# Patient Record
Sex: Male | Born: 1960 | Race: Black or African American | Hispanic: No | Smoking: Current every day smoker
Health system: Southern US, Community
[De-identification: ages and names within clinical notes are randomized; demographics above are authoritative.]

## PROBLEM LIST (undated history)

## (undated) DIAGNOSIS — F1410 Cocaine abuse, uncomplicated: Secondary | ICD-10-CM

## (undated) DIAGNOSIS — Z720 Tobacco use: Secondary | ICD-10-CM

## (undated) DIAGNOSIS — Z970 Presence of artificial eye: Secondary | ICD-10-CM

## (undated) DIAGNOSIS — F1010 Alcohol abuse, uncomplicated: Secondary | ICD-10-CM

## (undated) HISTORY — PX: EYE SURGERY: SHX253

## (undated) HISTORY — PX: OTHER SURGICAL HISTORY: SHX169

---

## 1983-01-02 DIAGNOSIS — Z970 Presence of artificial eye: Secondary | ICD-10-CM

## 1983-01-02 HISTORY — DX: Presence of artificial eye: Z97.0

## 2009-12-06 ENCOUNTER — Inpatient Hospital Stay (HOSPITAL_COMMUNITY)
Admission: EM | Admit: 2009-12-06 | Discharge: 2009-12-08 | Payer: Self-pay | Source: Home / Self Care | Attending: Internal Medicine | Admitting: Internal Medicine

## 2010-03-14 LAB — CBC
HCT: 39.2 % (ref 39.0–52.0)
Hemoglobin: 13.3 g/dL (ref 13.0–17.0)
Hemoglobin: 13.4 g/dL (ref 13.0–17.0)
MCH: 32.9 pg (ref 26.0–34.0)
MCHC: 34.2 g/dL (ref 30.0–36.0)
MCHC: 34.8 g/dL (ref 30.0–36.0)
MCV: 96.3 fL (ref 78.0–100.0)
RDW: 12.4 % (ref 11.5–15.5)
RDW: 12.5 % (ref 11.5–15.5)
WBC: 5.1 10*3/uL (ref 4.0–10.5)
WBC: 5.1 10*3/uL (ref 4.0–10.5)

## 2010-03-14 LAB — URINALYSIS, ROUTINE W REFLEX MICROSCOPIC
Glucose, UA: NEGATIVE mg/dL
Hgb urine dipstick: NEGATIVE

## 2010-03-14 LAB — POCT CARDIAC MARKERS: Myoglobin, poc: 47.4 ng/mL (ref 12–200)

## 2010-03-14 LAB — BASIC METABOLIC PANEL
CO2: 25 mEq/L (ref 19–32)
Chloride: 107 mEq/L (ref 96–112)
Creatinine, Ser: 0.83 mg/dL (ref 0.4–1.5)
GFR calc Af Amer: >60 mL/min (ref >60)

## 2010-03-14 LAB — LIPID PANEL
Cholesterol: 198 mg/dL (ref 0–200)
HDL: 69 mg/dL (ref >39)
Total CHOL/HDL Ratio: 2.9 RATIO
VLDL: 15 mg/dL (ref 0–40)

## 2010-03-14 LAB — DIFFERENTIAL
Basophils Absolute: 0 10*3/uL (ref 0.0–0.1)
Basophils Relative: 1 % (ref 0–1)
Eosinophils Absolute: 0.1 10*3/uL (ref 0.0–0.7)
Neutrophils Relative %: 58 % (ref 43–77)

## 2010-03-14 LAB — COMPREHENSIVE METABOLIC PANEL
AST: 20 U/L (ref 0–37)
Albumin: 4.2 g/dL (ref 3.5–5.2)
BUN: 8 mg/dL (ref 6–23)
CO2: 26 mEq/L (ref 19–32)
Calcium: 9 mg/dL (ref 8.4–10.5)
Glucose, Bld: 82 mg/dL (ref 70–99)
Sodium: 141 mEq/L (ref 135–145)
Total Protein: 6.5 g/dL (ref 6.0–8.3)

## 2010-03-14 LAB — CARDIAC PANEL(CRET KIN+CKTOT+MB+TROPI)
CK, MB: 1.2 ng/mL (ref 0.3–4.0)
Total CK: 125 U/L (ref 7–232)
Total CK: 150 U/L (ref 7–232)
Troponin I: 0.01 ng/mL (ref 0.00–0.06)

## 2010-03-14 LAB — APTT: aPTT: 30 seconds (ref 24–37)

## 2010-03-14 LAB — RAPID URINE DRUG SCREEN, HOSP PERFORMED
Benzodiazepines: NOT DETECTED
Opiates: NOT DETECTED

## 2010-03-14 LAB — PROTIME-INR: INR: 1.12 (ref 0.00–1.49)

## 2010-03-14 LAB — URINE CULTURE: Colony Count: NO GROWTH

## 2010-03-14 LAB — TSH: TSH: 0.311 u[IU]/mL — ABNORMAL LOW (ref 0.350–4.500)

## 2010-09-18 ENCOUNTER — Emergency Department (HOSPITAL_COMMUNITY)
Admission: EM | Admit: 2010-09-18 | Discharge: 2010-09-18 | Disposition: A | Discharge status: Home / Self Care | Payer: Self-pay | Attending: Emergency Medicine | Admitting: Emergency Medicine

## 2010-09-18 DIAGNOSIS — W268XXA Contact with other sharp object(s), not elsewhere classified, initial encounter: Secondary | ICD-10-CM | POA: Insufficient documentation

## 2010-09-18 DIAGNOSIS — S51809A Unspecified open wound of unspecified forearm, initial encounter: Secondary | ICD-10-CM | POA: Insufficient documentation

## 2010-09-18 DIAGNOSIS — S41109A Unspecified open wound of unspecified upper arm, initial encounter: Secondary | ICD-10-CM | POA: Insufficient documentation

## 2010-09-18 DIAGNOSIS — Y92009 Unspecified place in unspecified non-institutional (private) residence as the place of occurrence of the external cause: Secondary | ICD-10-CM | POA: Insufficient documentation

## 2010-09-18 DIAGNOSIS — R569 Unspecified convulsions: Secondary | ICD-10-CM | POA: Insufficient documentation

## 2010-10-10 ENCOUNTER — Inpatient Hospital Stay (INDEPENDENT_AMBULATORY_CARE_PROVIDER_SITE_OTHER)
Admission: RE | Admit: 2010-10-10 | Discharge: 2010-10-10 | Disposition: A | Discharge status: Home / Self Care | Payer: Medicaid Other | Source: Ambulatory Visit | Attending: Emergency Medicine | Admitting: Emergency Medicine

## 2010-10-10 DIAGNOSIS — Z4802 Encounter for removal of sutures: Secondary | ICD-10-CM

## 2013-12-25 ENCOUNTER — Encounter (HOSPITAL_COMMUNITY): Payer: Self-pay | Admitting: Emergency Medicine

## 2013-12-25 ENCOUNTER — Emergency Department (HOSPITAL_COMMUNITY)
Admission: EM | Admit: 2013-12-25 | Discharge: 2013-12-25 | Disposition: A | Discharge status: Home / Self Care | Payer: Medicaid Other | Attending: Emergency Medicine | Admitting: Emergency Medicine

## 2013-12-25 ENCOUNTER — Emergency Department (HOSPITAL_COMMUNITY): Payer: Medicaid Other

## 2013-12-25 DIAGNOSIS — S61218A Laceration without foreign body of other finger without damage to nail, initial encounter: Secondary | ICD-10-CM

## 2013-12-25 DIAGNOSIS — Z720 Tobacco use: Secondary | ICD-10-CM | POA: Diagnosis not present

## 2013-12-25 DIAGNOSIS — Y929 Unspecified place or not applicable: Secondary | ICD-10-CM | POA: Diagnosis not present

## 2013-12-25 DIAGNOSIS — W260XXA Contact with knife, initial encounter: Secondary | ICD-10-CM | POA: Diagnosis not present

## 2013-12-25 DIAGNOSIS — Z23 Encounter for immunization: Secondary | ICD-10-CM | POA: Diagnosis not present

## 2013-12-25 DIAGNOSIS — Y9389 Activity, other specified: Secondary | ICD-10-CM | POA: Insufficient documentation

## 2013-12-25 DIAGNOSIS — S61210A Laceration without foreign body of right index finger without damage to nail, initial encounter: Secondary | ICD-10-CM | POA: Insufficient documentation

## 2013-12-25 DIAGNOSIS — Y998 Other external cause status: Secondary | ICD-10-CM | POA: Diagnosis not present

## 2013-12-25 MED ORDER — HYDROCODONE-ACETAMINOPHEN 5-325 MG PO TABS: 1.0000 | ORAL_TABLET | Freq: Four times a day (QID) | ORAL | Status: DC | PRN

## 2013-12-25 MED ORDER — CEPHALEXIN 500 MG PO CAPS: 500.0000 mg | ORAL_CAPSULE | Freq: Two times a day (BID) | ORAL | Status: DC

## 2013-12-25 MED ORDER — LIDOCAINE HCL (PF) 1 % IJ SOLN
INTRAMUSCULAR | Status: AC
  Filled 2013-12-25: qty 10

## 2013-12-25 MED ORDER — CEPHALEXIN 250 MG PO CAPS
500.0000 mg | ORAL_CAPSULE | Freq: Once | ORAL | Status: AC
  Administered 2013-12-25: 500 mg via ORAL
  Filled 2013-12-25: qty 2

## 2013-12-25 MED ORDER — HYDROCODONE-ACETAMINOPHEN 5-325 MG PO TABS: 1.0000 | ORAL_TABLET | ORAL | Status: DC | PRN

## 2013-12-25 MED ORDER — LIDOCAINE HCL (PF) 1 % IJ SOLN: 30.0000 mL | Freq: Once | INTRAMUSCULAR | Status: DC

## 2013-12-25 MED ORDER — TETANUS-DIPHTH-ACELL PERTUSSIS 5-2.5-18.5 LF-MCG/0.5 IM SUSP
0.5000 mL | Freq: Once | INTRAMUSCULAR | Status: AC
  Administered 2013-12-25: 0.5 mL via INTRAMUSCULAR
  Filled 2013-12-25: qty 0.5

## 2013-12-25 MED ORDER — CEPHALEXIN 500 MG PO CAPS: 500.0000 mg | ORAL_CAPSULE | Freq: Two times a day (BID) | ORAL | Status: AC

## 2013-12-25 NOTE — Discharge Instructions (Signed)
Call and make an appointment to follow-up with Dr. Mina MarbleWeingold Monday morning. Return immediately to the emergency department for worsening pain, redness, swelling, fever or for any concerns.  Finger Avulsion  When the tip of the finger is lost, a new nail may grow back if part of the fingernail is left. The new nail may be deformed. If just the tip of the finger is lost, no repair may be needed unless there is bone showing. If bone is showing, your caregiver may need to remove the protruding bone and put on a bandage. Your caregiver will do what is best for you. Most of the time when a fingertip is lost, the end will gradually grow back on and look fairly normal, but it may remain sensitive to pressure and temperature extremes for a long time. HOME CARE INSTRUCTIONS   Keep your hand elevated above your heart to relieve pain and swelling.  Keep your dressing dry and clean.  Change your bandage in 24 hours or as directed.  Only take over-the-counter or prescription medicines for pain, discomfort, or fever as directed by your caregiver.  See your caregiver as needed for problems. SEEK MEDICAL CARE IF:   You have increased pain, swelling, drainage, or bleeding.  You have a fever.  You have swelling that spreads from your finger and into your hand. Make sure to check to see if you need a tetanus booster. Document Released: 02/26/2001 Document Revised: 06/19/2011 Document Reviewed: 01/22/2008 [facility] Patient Information 2015 [place]. This information is not intended to replace advice given to you by your health care provider. Make sure you discuss any questions you have with your health care provider.
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
## 2013-12-25 NOTE — ED Notes (Signed)
Pt given a meal per MD
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
## 2014-01-09 ENCOUNTER — Encounter (HOSPITAL_COMMUNITY): Payer: Self-pay | Admitting: *Deleted

## 2014-01-09 ENCOUNTER — Emergency Department (HOSPITAL_COMMUNITY)
Admission: EM | Admit: 2014-01-09 | Discharge: 2014-01-09 | Disposition: A | Discharge status: Home / Self Care | Payer: Medicaid Other | Attending: Emergency Medicine | Admitting: Emergency Medicine

## 2014-01-09 DIAGNOSIS — T814XXA Infection following a procedure, initial encounter: Secondary | ICD-10-CM | POA: Insufficient documentation

## 2014-01-09 DIAGNOSIS — Z720 Tobacco use: Secondary | ICD-10-CM | POA: Diagnosis not present

## 2014-01-09 DIAGNOSIS — M79641 Pain in right hand: Secondary | ICD-10-CM | POA: Diagnosis present

## 2014-01-09 DIAGNOSIS — T8140XA Infection following a procedure, unspecified, initial encounter: Secondary | ICD-10-CM

## 2014-01-09 DIAGNOSIS — Z87828 Personal history of other (healed) physical injury and trauma: Secondary | ICD-10-CM | POA: Insufficient documentation

## 2014-01-09 MED ORDER — HYDROCODONE-ACETAMINOPHEN 5-325 MG PO TABS
2.0000 | ORAL_TABLET | Freq: Once | ORAL | Status: AC
  Administered 2014-01-09: 2 via ORAL
  Filled 2014-01-09: qty 2

## 2014-01-09 MED ORDER — LIDOCAINE HCL (PF) 1 % IJ SOLN
5.0000 mL | Freq: Once | INTRAMUSCULAR | Status: AC
  Administered 2014-01-09: 5 mL via INTRADERMAL
  Filled 2014-01-09: qty 5

## 2014-01-09 MED ORDER — SULFAMETHOXAZOLE-TRIMETHOPRIM 800-160 MG PO TABS: 1.0000 | ORAL_TABLET | Freq: Two times a day (BID) | ORAL | Status: DC

## 2014-01-09 MED ORDER — NAPROXEN 500 MG PO TABS: 500.0000 mg | ORAL_TABLET | Freq: Two times a day (BID) | ORAL | Status: DC

## 2014-01-09 NOTE — Discharge Instructions (Signed)
Take the antibiotics as prescribed - see Dr. Merlyn LotKuzma for follow up   CHANGE DRESSING DAILY SOAK IN WARM SOAPY WATER DAILY  Please call your doctor for a followup appointment within 24-48 hours. When you talk to your doctor please let them know that you were seen in the emergency department and have them acquire all of your records so that they can discuss the findings with you and formulate a treatment plan to fully care for your new and ongoing problems.
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
## 2014-01-09 NOTE — ED Provider Notes (Signed)
INCISION AND DRAINAGE Performed by: Yussuf Sawyers K Consent: Verbal conseArman Filternt obtained. Risks and benefits: risks, benefits and alternatives were discussed Type: abscess  Body area:  R index finger tip   Anesthesia: local infiltration  Incision was made with a scalpel.  Local anesthetic: lidocaine 1% without epinephrine  Anesthetic total: 2.5 ml  Complexity: complex Blunt dissection to break up loculations  Drainage: purulent  Drainage amount: small  Packing material:  Left open   Patient tolerance: Patient tolerated the procedure well with no immediate complications.  &D NERVE BLOCK Performed by: Arman FilterSCHULZ,Umair Rosiles K Consent: Verbal consent obtained. Required items: required blood products, implants, devices, and special equipment available Time out: Immediately prior to procedure a "time out" was called to verify the correct patient, procedure, equipment, support staff and site/side marked as required.  Indication: infection  Nerve block body site: finger  Preparation: Patient was prepped and draped in the usual sterile fashion. Needle gauge: 24 G Location technique: anatomical landmarks  Local anesthetic: 1% lidocaine  Anesthetic total: 2/5 ml  Outcome: pain improved Patient tolerance: Patient tolerated the procedure well with no immediate complications.  Arman FilterGail K Lopaka Karge, NP 01/09/14 2147  Vida RollerBrian D Miller, MD 01/09/14 2202

## 2014-01-09 NOTE — ED Notes (Addendum)
Pt was seen here on 12/25 for laceration to right index finger, pt was referred to hand specialist but unable to follow up. Reports still having severe pain to finger.pt had original bandage still in place which was saturated, removed at triage. Reports having numbness sensation to left little finger x 1 week, denies injury to that finger.

## 2014-09-21 ENCOUNTER — Encounter (HOSPITAL_COMMUNITY): Payer: Self-pay | Admitting: Emergency Medicine

## 2014-09-21 ENCOUNTER — Emergency Department (HOSPITAL_COMMUNITY): Payer: Medicaid Other

## 2014-09-21 ENCOUNTER — Emergency Department (HOSPITAL_COMMUNITY)
Admission: EM | Admit: 2014-09-21 | Discharge: 2014-09-21 | Disposition: A | Discharge status: Home / Self Care | Payer: Medicaid Other | Attending: Emergency Medicine | Admitting: Emergency Medicine

## 2014-09-21 DIAGNOSIS — Y998 Other external cause status: Secondary | ICD-10-CM | POA: Diagnosis not present

## 2014-09-21 DIAGNOSIS — S4992XA Unspecified injury of left shoulder and upper arm, initial encounter: Secondary | ICD-10-CM | POA: Diagnosis present

## 2014-09-21 DIAGNOSIS — S40012A Contusion of left shoulder, initial encounter: Secondary | ICD-10-CM | POA: Diagnosis not present

## 2014-09-21 DIAGNOSIS — S8001XA Contusion of right knee, initial encounter: Secondary | ICD-10-CM | POA: Diagnosis not present

## 2014-09-21 DIAGNOSIS — F1410 Cocaine abuse, uncomplicated: Secondary | ICD-10-CM | POA: Diagnosis not present

## 2014-09-21 DIAGNOSIS — S7002XA Contusion of left hip, initial encounter: Secondary | ICD-10-CM | POA: Insufficient documentation

## 2014-09-21 DIAGNOSIS — Z720 Tobacco use: Secondary | ICD-10-CM | POA: Insufficient documentation

## 2014-09-21 DIAGNOSIS — S8002XA Contusion of left knee, initial encounter: Secondary | ICD-10-CM | POA: Diagnosis not present

## 2014-09-21 DIAGNOSIS — Y9389 Activity, other specified: Secondary | ICD-10-CM | POA: Diagnosis not present

## 2014-09-21 DIAGNOSIS — S7001XA Contusion of right hip, initial encounter: Secondary | ICD-10-CM | POA: Insufficient documentation

## 2014-09-21 DIAGNOSIS — Y92410 Unspecified street and highway as the place of occurrence of the external cause: Secondary | ICD-10-CM | POA: Insufficient documentation

## 2014-09-21 DIAGNOSIS — Z792 Long term (current) use of antibiotics: Secondary | ICD-10-CM | POA: Insufficient documentation

## 2014-09-21 DIAGNOSIS — F10120 Alcohol abuse with intoxication, uncomplicated: Secondary | ICD-10-CM | POA: Insufficient documentation

## 2014-09-21 DIAGNOSIS — F1010 Alcohol abuse, uncomplicated: Secondary | ICD-10-CM

## 2014-09-21 DIAGNOSIS — S0990XA Unspecified injury of head, initial encounter: Secondary | ICD-10-CM | POA: Insufficient documentation

## 2014-09-21 DIAGNOSIS — S0081XA Abrasion of other part of head, initial encounter: Secondary | ICD-10-CM | POA: Diagnosis not present

## 2014-09-21 LAB — RAPID URINE DRUG SCREEN, HOSP PERFORMED
Amphetamines: NOT DETECTED
BENZODIAZEPINES: NOT DETECTED
Barbiturates: NOT DETECTED
COCAINE: POSITIVE — AB
Opiates: NOT DETECTED
TETRAHYDROCANNABINOL: NOT DETECTED

## 2014-09-21 LAB — ETHANOL: ALCOHOL ETHYL (B): 167 mg/dL — AB (ref <5)

## 2014-09-21 MED ORDER — NAPROXEN 500 MG PO TABS: 500.0000 mg | ORAL_TABLET | Freq: Two times a day (BID) | ORAL | Status: DC

## 2014-09-21 MED ORDER — HYDROCODONE-ACETAMINOPHEN 5-325 MG PO TABS
1.0000 | ORAL_TABLET | Freq: Once | ORAL | Status: AC
  Administered 2014-09-21: 1 via ORAL
  Filled 2014-09-21: qty 1
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
## 2014-09-21 NOTE — Discharge Instructions (Signed)
You were seen today and found to have an abrasion on her face and a bruise to your left shoulder. The rest of your workup is negative.  He can take naproxen twice daily as needed for pain.  Abrasion An abrasion is a cut or scrape of the skin. Abrasions do not extend through all layers of the skin and most heal within 10 days. It is important to care for your abrasion properly to prevent infection. CAUSES  Most abrasions are caused by falling on, or gliding across, the ground or other surface. When your skin rubs on something, the outer and inner layer of skin rubs off, causing an abrasion. DIAGNOSIS  Your caregiver will be able to diagnose an abrasion during a physical exam.  TREATMENT  Your treatment depends on how large and deep the abrasion is. Generally, your abrasion will be cleaned with water and a mild soap to remove any dirt or debris. An antibiotic ointment may be put over the abrasion to prevent an infection. A bandage (dressing) may be wrapped around the abrasion to keep it from getting dirty.  You may need a tetanus shot if:  You cannot remember when you had your last tetanus shot.  You have never had a tetanus shot.  The injury broke your skin. If you get a tetanus shot, your arm may swell, get red, and feel warm to the touch. This is common and not a problem. If you need a tetanus shot and you choose not to have one, there is a rare chance of getting tetanus. Sickness from tetanus can be serious.  HOME CARE INSTRUCTIONS   If a dressing was applied, change it at least once a day or as directed by your caregiver. If the bandage sticks, soak it off with warm water.   Wash the area with water and a mild soap to remove all the ointment 2 times a day. Rinse off the soap and pat the area dry with a clean towel.   Reapply any ointment as directed by your caregiver. This will help prevent infection and keep the bandage from sticking. Use gauze over the wound and under the dressing to  help keep the bandage from sticking.   Change your dressing right away if it becomes wet or dirty.   Only take over-the-counter or prescription medicines for pain, discomfort, or fever as directed by your caregiver.   Follow up with your caregiver within 24-48 hours for a wound check, or as directed. If you were not given a wound-check appointment, look closely at your abrasion for redness, swelling, or pus. These are signs of infection. SEEK IMMEDIATE MEDICAL CARE IF:   You have increasing pain in the wound.   You have redness, swelling, or tenderness around the wound.   You have pus coming from the wound.   You have a fever or persistent symptoms for more than 2-3 days.  You have a fever and your symptoms suddenly get worse.  You have a bad smell coming from the wound or dressing.  MAKE SURE YOU:   Understand these instructions.  Will watch your condition.  Will get help right away if you are not doing well or get worse. Document Released: 09/27/2004 Document Revised: 12/05/2011 Document Reviewed: 11/21/2010 [facility] Patient Information 2015 [place], [place]. This information is not intended to replace advice given to you by your health care provider. Make sure you discuss any questions you have with your health care provider.  Alcohol Intoxication Alcohol intoxication occurs when  the amount of alcohol that a person has consumed impairs his or her ability to mentally and physically function. Alcohol directly impairs the normal chemical activity of the brain. Drinking large amounts of alcohol can lead to changes in mental function and behavior, and it can cause many physical effects that can be harmful.  Alcohol intoxication can range in severity from mild to very severe. Various factors can affect the level of intoxication that occurs, such as the person's age, gender, weight, frequency of alcohol consumption, and the presence of other medical conditions (such as diabetes,  seizures, or heart conditions). Dangerous levels of alcohol intoxication may occur when people drink large amounts of alcohol in a short period (binge drinking). Alcohol can also be especially dangerous when combined with certain prescription medicines or "recreational" drugs. SIGNS AND SYMPTOMS Some common signs and symptoms of mild alcohol intoxication include:  Loss of coordination.  Changes in mood and behavior.  Impaired judgment.  Slurred speech. As alcohol intoxication progresses to more severe levels, other signs and symptoms will appear. These may include:  Vomiting.  Confusion and impaired memory.  Slowed breathing.  Seizures.  Loss of consciousness. DIAGNOSIS  Your health care provider will take a medical history and perform a physical exam. You will be asked about the amount and type of alcohol you have consumed. Blood tests will be done to measure the concentration of alcohol in your blood. In many places, your blood alcohol level must be lower than 80 mg/dL (1.19%) to legally drive. However, many dangerous effects of alcohol can occur at much lower levels.  TREATMENT  People with alcohol intoxication often do not require treatment. Most of the effects of alcohol intoxication are temporary, and they go away as the alcohol naturally leaves the body. Your health care provider will monitor your condition until you are stable enough to go home. Fluids are sometimes given through an IV access tube to help prevent dehydration.  HOME CARE INSTRUCTIONS  Do not drive after drinking alcohol.  Stay hydrated. Drink enough water and fluids to keep your urine clear or pale yellow. Avoid caffeine.   Only take over-the-counter or prescription medicines as directed by your health care provider.  SEEK MEDICAL CARE IF:   You have persistent vomiting.   You do not feel better after a few days.  You have frequent alcohol intoxication. Your health care provider can help determine if  you should see a substance use treatment counselor. SEEK IMMEDIATE MEDICAL CARE IF:   You become shaky or tremble when you try to stop drinking.   You shake uncontrollably (seizure).   You throw up (vomit) blood. This may be bright red or may look like black coffee grounds.   You have blood in your stool. This may be bright red or may appear as a black, tarry, bad smelling stool.   You become lightheaded or faint.  MAKE SURE YOU:   Understand these instructions.  Will watch your condition.  Will get help right away if you are not doing well or get worse. Document Released: 09/27/2004 Document Revised: 08/20/2012 Document Reviewed: 05/23/2012 [facility] Patient Information 2015 [place], [place]. This information is not intended to replace advice given to you by your health care provider. Make sure you discuss any questions you have with your health care provider.
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
## 2014-09-21 NOTE — ED Notes (Signed)
Pt in radiology 
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
## 2016-10-02 ENCOUNTER — Inpatient Hospital Stay (HOSPITAL_COMMUNITY)
Admission: EM | Admit: 2016-10-02 | Discharge: 2016-10-04 | DRG: 313 | Disposition: A | Discharge status: Home / Self Care | Payer: Medicaid Other | Attending: Internal Medicine | Admitting: Internal Medicine

## 2016-10-02 ENCOUNTER — Emergency Department (HOSPITAL_COMMUNITY): Payer: Medicaid Other

## 2016-10-02 ENCOUNTER — Encounter (HOSPITAL_COMMUNITY): Payer: Self-pay

## 2016-10-02 DIAGNOSIS — Z5900 Homelessness unspecified: Secondary | ICD-10-CM

## 2016-10-02 DIAGNOSIS — R001 Bradycardia, unspecified: Secondary | ICD-10-CM | POA: Diagnosis present

## 2016-10-02 DIAGNOSIS — M79662 Pain in left lower leg: Secondary | ICD-10-CM | POA: Diagnosis not present

## 2016-10-02 DIAGNOSIS — Z79899 Other long term (current) drug therapy: Secondary | ICD-10-CM

## 2016-10-02 DIAGNOSIS — Z820 Family history of epilepsy and other diseases of the nervous system: Secondary | ICD-10-CM

## 2016-10-02 DIAGNOSIS — R079 Chest pain, unspecified: Secondary | ICD-10-CM | POA: Diagnosis not present

## 2016-10-02 DIAGNOSIS — E876 Hypokalemia: Secondary | ICD-10-CM | POA: Diagnosis not present

## 2016-10-02 DIAGNOSIS — Z970 Presence of artificial eye: Secondary | ICD-10-CM

## 2016-10-02 DIAGNOSIS — F1290 Cannabis use, unspecified, uncomplicated: Secondary | ICD-10-CM | POA: Diagnosis present

## 2016-10-02 DIAGNOSIS — M79669 Pain in unspecified lower leg: Secondary | ICD-10-CM

## 2016-10-02 DIAGNOSIS — Z833 Family history of diabetes mellitus: Secondary | ICD-10-CM

## 2016-10-02 DIAGNOSIS — F1410 Cocaine abuse, uncomplicated: Secondary | ICD-10-CM | POA: Diagnosis not present

## 2016-10-02 DIAGNOSIS — Z23 Encounter for immunization: Secondary | ICD-10-CM

## 2016-10-02 DIAGNOSIS — Z8249 Family history of ischemic heart disease and other diseases of the circulatory system: Secondary | ICD-10-CM

## 2016-10-02 DIAGNOSIS — Z720 Tobacco use: Secondary | ICD-10-CM

## 2016-10-02 DIAGNOSIS — F1010 Alcohol abuse, uncomplicated: Secondary | ICD-10-CM | POA: Diagnosis present

## 2016-10-02 DIAGNOSIS — F17210 Nicotine dependence, cigarettes, uncomplicated: Secondary | ICD-10-CM | POA: Diagnosis present

## 2016-10-02 HISTORY — DX: Presence of artificial eye: Z97.0

## 2016-10-02 HISTORY — DX: Alcohol abuse, uncomplicated: F10.10

## 2016-10-02 HISTORY — DX: Tobacco use: Z72.0

## 2016-10-02 HISTORY — DX: Cocaine abuse, uncomplicated: F14.10

## 2016-10-02 LAB — RAPID URINE DRUG SCREEN, HOSP PERFORMED
Amphetamines: NOT DETECTED
BARBITURATES: NOT DETECTED
Benzodiazepines: NOT DETECTED
Cocaine: POSITIVE — AB
Opiates: NOT DETECTED
TETRAHYDROCANNABINOL: POSITIVE — AB

## 2016-10-02 LAB — PROTIME-INR
INR: 1.09
PROTHROMBIN TIME: 14 s (ref 11.4–15.2)

## 2016-10-02 LAB — BASIC METABOLIC PANEL
Anion gap: 12 (ref 5–15)
BUN: 5 mg/dL — AB (ref 6–20)
CO2: 23 mmol/L (ref 22–32)
Calcium: 8.8 mg/dL — ABNORMAL LOW (ref 8.9–10.3)
Chloride: 101 mmol/L (ref 101–111)
Creatinine, Ser: 0.94 mg/dL (ref 0.61–1.24)
GFR calc Af Amer: >60 mL/min (ref >60)
GFR calc non Af Amer: >60 mL/min (ref >60)
Glucose, Bld: 121 mg/dL — ABNORMAL HIGH (ref 65–99)
Potassium: 3.3 mmol/L — ABNORMAL LOW (ref 3.5–5.1)
Sodium: 136 mmol/L (ref 135–145)

## 2016-10-02 LAB — CBC
HCT: 39 % (ref 39.0–52.0)
Hemoglobin: 13.5 g/dL (ref 13.0–17.0)
MCH: 33.5 pg (ref 26.0–34.0)
MCHC: 34.6 g/dL (ref 30.0–36.0)
MCV: 96.8 fL (ref 78.0–100.0)
Platelets: 208 10*3/uL (ref 150–400)
RBC: 4.03 MIL/uL — ABNORMAL LOW (ref 4.22–5.81)
RDW: 12.7 % (ref 11.5–15.5)
WBC: 5.7 10*3/uL (ref 4.0–10.5)

## 2016-10-02 LAB — TROPONIN I: Troponin I: <0.03 ng/mL (ref <0.03)

## 2016-10-02 LAB — I-STAT TROPONIN, ED: Troponin i, poc: 0 ng/mL (ref 0.00–0.08)

## 2016-10-02 LAB — D-DIMER, QUANTITATIVE (NOT AT ARMC): D DIMER QUANT: 0.34 ug{FEU}/mL (ref 0.00–0.50)

## 2016-10-02 MED ORDER — ENOXAPARIN SODIUM 40 MG/0.4ML ~~LOC~~ SOLN
40.0000 mg | SUBCUTANEOUS | Status: DC
  Administered 2016-10-03 – 2016-10-04 (×2): 40 mg via SUBCUTANEOUS
  Filled 2016-10-02 (×2): qty 0.4

## 2016-10-02 MED ORDER — POTASSIUM CHLORIDE 20 MEQ/15ML (10%) PO SOLN
20.0000 meq | Freq: Once | ORAL | Status: AC
  Administered 2016-10-02: 20 meq via ORAL
  Filled 2016-10-02: qty 15

## 2016-10-02 MED ORDER — NITROGLYCERIN 0.4 MG SL SUBL: 0.4000 mg | SUBLINGUAL_TABLET | SUBLINGUAL | Status: DC | PRN

## 2016-10-02 MED ORDER — NICOTINE 21 MG/24HR TD PT24
21.0000 mg | MEDICATED_PATCH | Freq: Every day | TRANSDERMAL | Status: DC
Start: 2016-10-03 — End: 2016-10-04
  Administered 2016-10-03 – 2016-10-04 (×2): 21 mg via TRANSDERMAL
  Filled 2016-10-02 (×2): qty 1

## 2016-10-02 MED ORDER — LORAZEPAM 2 MG/ML IJ SOLN: 1.0000 mg | Freq: Four times a day (QID) | INTRAMUSCULAR | Status: DC | PRN

## 2016-10-02 MED ORDER — LORAZEPAM 1 MG PO TABS: 1.0000 mg | ORAL_TABLET | Freq: Four times a day (QID) | ORAL | Status: DC | PRN

## 2016-10-02 MED ORDER — ZOLPIDEM TARTRATE 5 MG PO TABS: 5.0000 mg | ORAL_TABLET | Freq: Every evening | ORAL | Status: DC | PRN

## 2016-10-02 MED ORDER — ADULT MULTIVITAMIN W/MINERALS CH
1.0000 | ORAL_TABLET | Freq: Every day | ORAL | Status: DC
  Administered 2016-10-03 – 2016-10-04 (×2): 1 via ORAL
  Filled 2016-10-02 (×2): qty 1

## 2016-10-02 MED ORDER — KETOROLAC TROMETHAMINE 30 MG/ML IJ SOLN
30.0000 mg | Freq: Once | INTRAMUSCULAR | Status: AC
  Administered 2016-10-02: 30 mg via INTRAVENOUS
  Filled 2016-10-02: qty 1

## 2016-10-02 MED ORDER — ASPIRIN 325 MG PO TABS
325.0000 mg | ORAL_TABLET | Freq: Every day | ORAL | Status: DC
  Administered 2016-10-03 – 2016-10-04 (×2): 325 mg via ORAL
  Filled 2016-10-02 (×2): qty 1

## 2016-10-02 MED ORDER — LORAZEPAM 2 MG/ML IJ SOLN
0.0000 mg | Freq: Two times a day (BID) | INTRAMUSCULAR | Status: DC
Start: 2016-10-04 — End: 2016-10-04

## 2016-10-02 MED ORDER — FOLIC ACID 1 MG PO TABS
1.0000 mg | ORAL_TABLET | Freq: Every day | ORAL | Status: DC
  Administered 2016-10-03 – 2016-10-04 (×2): 1 mg via ORAL
  Filled 2016-10-02 (×2): qty 1

## 2016-10-02 MED ORDER — ACETAMINOPHEN 325 MG PO TABS: 650.0000 mg | ORAL_TABLET | ORAL | Status: DC | PRN

## 2016-10-02 MED ORDER — ONDANSETRON HCL 4 MG/2ML IJ SOLN: 4.0000 mg | Freq: Four times a day (QID) | INTRAMUSCULAR | Status: DC | PRN

## 2016-10-02 MED ORDER — THIAMINE HCL 100 MG/ML IJ SOLN: 100.0000 mg | Freq: Every day | INTRAMUSCULAR | Status: DC

## 2016-10-02 MED ORDER — IBUPROFEN 200 MG PO TABS: 400.0000 mg | ORAL_TABLET | Freq: Four times a day (QID) | ORAL | Status: DC | PRN

## 2016-10-02 MED ORDER — LORAZEPAM 2 MG/ML IJ SOLN: 0.0000 mg | Freq: Four times a day (QID) | INTRAMUSCULAR | Status: DC

## 2016-10-02 MED ORDER — VITAMIN B-1 100 MG PO TABS
100.0000 mg | ORAL_TABLET | Freq: Every day | ORAL | Status: DC
  Administered 2016-10-03 – 2016-10-04 (×2): 100 mg via ORAL
  Filled 2016-10-02 (×2): qty 1

## 2016-10-02 MED ORDER — SODIUM CHLORIDE 0.9 % IV SOLN
INTRAVENOUS | Status: DC
  Administered 2016-10-02 – 2016-10-03 (×2): via INTRAVENOUS
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
## 2016-10-02 NOTE — ED Notes (Signed)
Called x 1 without response.
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
## 2016-10-02 NOTE — ED Triage Notes (Addendum)
Pt presents via [facility] for evaluation of L upper chest pain following smoking cocaine. Pt reports pain worse with palpation and deep breathing. Ambulatory, given 324 ASA by EMS

## 2016-10-03 ENCOUNTER — Observation Stay (HOSPITAL_COMMUNITY): Payer: Medicaid Other

## 2016-10-03 ENCOUNTER — Encounter (HOSPITAL_COMMUNITY): Payer: Self-pay

## 2016-10-03 DIAGNOSIS — E876 Hypokalemia: Secondary | ICD-10-CM | POA: Diagnosis present

## 2016-10-03 DIAGNOSIS — Z23 Encounter for immunization: Secondary | ICD-10-CM | POA: Diagnosis not present

## 2016-10-03 DIAGNOSIS — F17210 Nicotine dependence, cigarettes, uncomplicated: Secondary | ICD-10-CM | POA: Diagnosis present

## 2016-10-03 DIAGNOSIS — I351 Nonrheumatic aortic (valve) insufficiency: Secondary | ICD-10-CM

## 2016-10-03 DIAGNOSIS — R001 Bradycardia, unspecified: Secondary | ICD-10-CM | POA: Diagnosis present

## 2016-10-03 DIAGNOSIS — Z8249 Family history of ischemic heart disease and other diseases of the circulatory system: Secondary | ICD-10-CM | POA: Diagnosis not present

## 2016-10-03 DIAGNOSIS — F1290 Cannabis use, unspecified, uncomplicated: Secondary | ICD-10-CM | POA: Diagnosis present

## 2016-10-03 DIAGNOSIS — M79669 Pain in unspecified lower leg: Secondary | ICD-10-CM | POA: Diagnosis not present

## 2016-10-03 DIAGNOSIS — Z5900 Homelessness unspecified: Secondary | ICD-10-CM

## 2016-10-03 DIAGNOSIS — R079 Chest pain, unspecified: Secondary | ICD-10-CM | POA: Diagnosis present

## 2016-10-03 DIAGNOSIS — F1010 Alcohol abuse, uncomplicated: Secondary | ICD-10-CM | POA: Diagnosis present

## 2016-10-03 DIAGNOSIS — Z720 Tobacco use: Secondary | ICD-10-CM | POA: Diagnosis not present

## 2016-10-03 DIAGNOSIS — Z970 Presence of artificial eye: Secondary | ICD-10-CM | POA: Diagnosis not present

## 2016-10-03 DIAGNOSIS — Z833 Family history of diabetes mellitus: Secondary | ICD-10-CM | POA: Diagnosis not present

## 2016-10-03 DIAGNOSIS — M79662 Pain in left lower leg: Secondary | ICD-10-CM | POA: Diagnosis present

## 2016-10-03 DIAGNOSIS — F1410 Cocaine abuse, uncomplicated: Secondary | ICD-10-CM | POA: Diagnosis present

## 2016-10-03 DIAGNOSIS — Z820 Family history of epilepsy and other diseases of the nervous system: Secondary | ICD-10-CM | POA: Diagnosis not present

## 2016-10-03 DIAGNOSIS — Z79899 Other long term (current) drug therapy: Secondary | ICD-10-CM | POA: Diagnosis not present

## 2016-10-03 LAB — HIV ANTIBODY (ROUTINE TESTING W REFLEX): HIV Screen 4th Generation wRfx: NONREACTIVE

## 2016-10-03 LAB — ECHOCARDIOGRAM COMPLETE
Height: 69 in
Weight: 2281.6 oz

## 2016-10-03 LAB — HEMOGLOBIN A1C
Hgb A1c MFr Bld: 5 % (ref 4.8–5.6)
MEAN PLASMA GLUCOSE: 96.8 mg/dL

## 2016-10-03 LAB — LIPID PANEL
CHOL/HDL RATIO: 2 ratio
Cholesterol: 178 mg/dL (ref 0–200)
HDL: 91 mg/dL (ref >40)
LDL CALC: 82 mg/dL (ref 0–99)
TRIGLYCERIDES: 24 mg/dL (ref <150)
VLDL: 5 mg/dL (ref 0–40)

## 2016-10-03 LAB — TROPONIN I
Troponin I: <0.03 ng/mL (ref <0.03)
Troponin I: <0.03 ng/mL (ref <0.03)

## 2016-10-03 MED ORDER — INFLUENZA VAC SPLIT QUAD 0.5 ML IM SUSY
0.5000 mL | PREFILLED_SYRINGE | INTRAMUSCULAR | Status: AC
  Administered 2016-10-04: 0.5 mL via INTRAMUSCULAR
  Filled 2016-10-03: qty 0.5

## 2016-10-03 MED ORDER — PNEUMOCOCCAL VAC POLYVALENT 25 MCG/0.5ML IJ INJ
0.5000 mL | INJECTION | INTRAMUSCULAR | Status: AC
  Administered 2016-10-04: 0.5 mL via INTRAMUSCULAR
  Filled 2016-10-03: qty 0.5

## 2016-10-03 NOTE — Progress Notes (Signed)
Preliminary results by tech - Left Lower Ext. Venous Duplex Completed. Negative for deep and superficial vein thrombosis.  Darrio Bade, BS, RDMS, RVT  
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
## 2016-10-03 NOTE — Plan of Care (Signed)
Problem: Health Behavior/Discharge Planning: Goal: Ability to manage health-related needs will improve Outcome: Progressing Patient stating he would like to stop using, Social Work consulted  Problem: Pain Managment: Goal: General experience of comfort will improve Outcome: Progressing Patient has had no complaints of pain since arriving to floor.   Problem: Physical Regulation: Goal: Ability to maintain clinical measurements within normal limits will improve Outcome: Progressing Patient rested comfortably through the night. With rest, patient HR maintained in the 50's. No further complaints of chest pain. NPO this AM to be seen by cardiology.

## 2016-10-03 NOTE — Clinical Social Work Note (Signed)
Clinical Social Work Assessment  Patient Details  Name: Anthony Johnston MRN: [id number] Date of Birth: December 05, 1960  Date of referral:  10/03/16               Reason for consult:  Substance Use/ETOH Abuse                Permission sought to share information with:  Facility Art therapist granted to share information::  Yes, Verbal Permission Granted  Name::        Agency::  [facility]  Relationship::     Contact Information:     Housing/Transportation Living arrangements for the past 2 months:  Homeless Source of Information:  Patient Patient Interpreter Needed:  None Criminal Activity/Legal Involvement Pertinent to Current Situation/Hospitalization:  No - Comment as needed Significant Relationships:  Siblings Lives with:  Self Do you feel safe going back to the place where you live?  No Need for family participation in patient care:  No (Coment)  Care giving concerns: Patient reports he is homeless, wants inpatient substance use treatment for ETOH, cocaine.   Social Worker assessment / plan: CSW met with patient at bedside. Patient alert and oriented. CSW offered list of resources for inpatient substance use treatment. Patient became frustrated and indicated he cannot read and cannot see to use the phone, so would be unable to call the treatment centers. Patient stated, "I will just go back out there and use and come right back here to the hospital if I don't go somewhere [treatment] today." Patient reported he was in treatment at [facility] in the past and was agreeable for CSW to make referral there. CSW left message with [facility] intake line, awaiting call back. CSW to support with referral to inpatient rehab.  Employment status:  Unemployed Forensic scientist:  Medicaid In [place] PT Recommendations:  Not assessed at this time Information / Referral to community resources:  Residential Substance Abuse Treatment Options  Patient/Family's Response to care:  Patient appreciative of referral to [employer].  Patient/Family's Understanding of and Emotional Response to Diagnosis, Current Treatment, and Prognosis: Patient hopeful for inpatient substance use rehab bed.  Emotional Assessment Appearance:  Appears stated age Attitude/Demeanor/Rapport:  Other Affect (typically observed):  Frustrated Orientation:  Oriented to Self, Oriented to Situation, Oriented to Place, Oriented to  Time Alcohol / Substance use:  Alcohol Use, Illicit Drugs Psych involvement (Current and /or in the community):  No (Comment)  Discharge Needs  Concerns to be addressed:  Homelessness, Substance Abuse Concerns Readmission within the last 30 days:  No Current discharge risk:  Substance Abuse, Homeless Barriers to Discharge:  Continued Medical Work up   Estanislado Emms, LCSW 10/03/2016, 12:16 PM
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
## 2016-10-04 DIAGNOSIS — F1010 Alcohol abuse, uncomplicated: Secondary | ICD-10-CM

## 2016-10-04 MED ORDER — ADULT MULTIVITAMIN W/MINERALS CH: 1.0000 | ORAL_TABLET | Freq: Every day | ORAL | 1 refills | Status: AC

## 2016-10-04 MED ORDER — NICOTINE 21 MG/24HR TD PT24: 21.0000 mg | MEDICATED_PATCH | Freq: Every day | TRANSDERMAL | 0 refills | Status: AC

## 2016-10-04 NOTE — Discharge Instructions (Addendum)
Follow with [facility] in 5-7 days  Please get a complete blood count and chemistry panel checked by your Primary MD at your next visit, and again as instructed by your Primary MD. Please get your medications reviewed and adjusted by your Primary MD.  Please request your Primary MD to go over all Hospital Tests and Procedure/Radiological results at the follow up, please get all Hospital records sent to your Prim MD by signing hospital release before you go home.  If you had Pneumonia of Lung problems at the Hospital: Please get a 2 view Chest X ray done in 6-8 weeks after hospital discharge or sooner if instructed by your Primary MD.  If you have Congestive Heart Failure: Please call your Cardiologist or Primary MD anytime you have any of the following symptoms:  1) 3 pound weight gain in 24 hours or 5 pounds in 1 week  2) shortness of breath, with or without a dry hacking cough  3) swelling in the hands, feet or stomach  4) if you have to sleep on extra pillows at night in order to breathe  Follow cardiac low salt diet and 1.5 lit/day fluid restriction.  If you have diabetes Accuchecks 4 times/day, Once in AM empty stomach and then before each meal. Log in all results and show them to your primary doctor at your next visit. If any glucose reading is under 80 or above 300 call your primary MD immediately.  If you have Seizure/Convulsions/Epilepsy: Please do not drive, operate heavy machinery, participate in activities at heights or participate in high speed sports until you have seen by Primary MD or a Neurologist and advised to do so again.  If you had Gastrointestinal Bleeding: Please ask your Primary MD to check a complete blood count within one week of discharge or at your next visit. Your endoscopic/colonoscopic biopsies that are pending at the time of discharge, will also need to followed by your Primary MD.  Get Medicines reviewed and adjusted. Please take all  your medications with you for your next visit with your Primary MD  Please request your Primary MD to go over all hospital tests and procedure/radiological results at the follow up, please ask your Primary MD to get all Hospital records sent to his/her office.  If you experience worsening of your admission symptoms, develop shortness of breath, life threatening emergency, suicidal or homicidal thoughts you must seek medical attention immediately by calling 911 or calling your MD immediately  if symptoms less severe.  You must read complete instructions/literature along with all the possible adverse reactions/side effects for all the Medicines you take and that have been prescribed to you. Take any new Medicines after you have completely understood and accpet all the possible adverse reactions/side effects.   Do not drive or operate heavy machinery when taking Pain medications.   Do not take more than prescribed Pain, Sleep and Anxiety Medications  Special Instructions: If you have smoked or chewed Tobacco  in the last 2 yrs please stop smoking, stop any regular Alcohol  and or any Recreational drug use.  Wear Seat belts while driving.  Please note You were cared for by a hospitalist during your hospital stay. If you have any questions about your discharge medications or the care you received while you were in the hospital after you are discharged, you can call the unit and asked to speak with the hospitalist on call if the hospitalist that took care of you is not available.  Once you are discharged, your primary care physician will handle any further medical issues. Please note that NO REFILLS for any discharge medications will be authorized once you are discharged, as it is imperative that you return to your primary care physician (or establish a relationship with a primary care physician if you do not have one) for your aftercare needs so that they can reassess your need for medications and monitor  your lab values.  You can reach the hospitalist office at phone [phone] or fax [phone]   If you do not have a primary care physician, you can call [phone] for a physician referral.  Activity: As tolerated with Full fall precautions use walker/cane & assistance as needed  Diet: regular  Disposition Shelter    Heart-Healthy Eating Plan Heart-healthy meal planning includes:  Limiting unhealthy fats.  Increasing healthy fats.  Making other small dietary changes.  You may need to talk with your doctor or a diet specialist (dietitian) to create an eating plan that is right for you. What types of fat should I choose?  Choose healthy fats. These include olive oil and canola oil, flaxseeds, walnuts, almonds, and seeds.  Eat more omega-3 fats. These include salmon, mackerel, sardines, tuna, flaxseed oil, and ground flaxseeds. Try to eat fish at least twice each week.  Limit saturated fats. ? Saturated fats are often found in animal products, such as meats, butter, and cream. ? Plant sources of saturated fats include palm oil, palm kernel oil, and coconut oil.  Avoid foods with partially hydrogenated oils in them. These include stick margarine, some tub margarines, cookies, crackers, and other baked goods. These contain trans fats. What general guidelines do I need to follow?  Check food labels carefully. Identify foods with trans fats or high amounts of saturated fat.  Fill one half of your plate with vegetables and green salads. Eat 4-5 servings of vegetables per day. A serving of vegetables is: ? 1 cup of raw leafy vegetables. ?  cup of raw or cooked cut-up vegetables. ?  cup of vegetable juice.  Fill one fourth of your plate with whole grains. Look for the word "whole" as the first word in the ingredient list.  Fill one fourth of your plate with lean protein foods.  Eat 4-5 servings of fruit per day. A serving of fruit is: ? One medium whole fruit. ?  cup of  dried fruit. ?  cup of fresh, frozen, or canned fruit. ?  cup of 100% fruit juice.  Eat more foods that contain soluble fiber. These include apples, broccoli, carrots, beans, peas, and barley. Try to get 20-30 g of fiber per day.  Eat more home-cooked food. Eat less restaurant, buffet, and fast food.  Limit or avoid alcohol.  Limit foods high in starch and sugar.  Avoid fried foods.  Avoid frying your food. Try baking, boiling, grilling, or broiling it instead. You can also reduce fat by: ? Removing the skin from poultry. ? Removing all visible fats from meats. ? Skimming the fat off of stews, soups, and gravies before serving them. ? Steaming vegetables in water or broth.  Lose weight if you are overweight.  Eat 4-5 servings of nuts, legumes, and seeds per week: ? One serving of dried beans or legumes equals  cup after being cooked. ? One serving of nuts equals 1 ounces. ? One serving of seeds equals  ounce or one tablespoon.  You may need to keep track of how much salt or sodium  you eat. This is especially true if you have high blood pressure. Talk with your doctor or dietitian to get more information. What foods can I eat? Grains Breads, including [place], white, pita, wheat, raisin, rye, oatmeal, and [place]. Tortillas that are neither fried nor made with lard or trans fat. Low-fat rolls, including hotdog and hamburger buns and English muffins. Biscuits. Muffins. Waffles. Pancakes. Light popcorn. Whole-grain cereals. Flatbread. Melba toast. Pretzels. Breadsticks. Rusks. Low-fat snacks. Low-fat crackers, including oyster, saltine, matzo, graham, animal, and rye. Rice and pasta, including [place] rice and pastas that are made with whole wheat. Vegetables All vegetables. Fruits All fruits, but limit coconut. Meats and Other Protein Sources Lean, well-trimmed beef, veal, pork, and lamb. Chicken and [place] without skin. All fish and shellfish. Wild duck, rabbit, pheasant, and  venison. Egg whites or low-cholesterol egg substitutes. Dried beans, peas, lentils, and tofu. Seeds and most nuts. Dairy Low-fat or nonfat cheeses, including ricotta, string, and mozzarella. Skim or 1% milk that is liquid, powdered, or evaporated. Buttermilk that is made with low-fat milk. Nonfat or low-fat yogurt. Beverages Mineral water. Diet carbonated beverages. Sweets and Desserts Sherbets and fruit ices. Honey, jam, marmalade, jelly, and syrups. Meringues and gelatins. Pure sugar candy, such as hard candy, jelly beans, gumdrops, mints, marshmallows, and small amounts of dark chocolate. [employer]. Eat all sweets and desserts in moderation. Fats and Oils Nonhydrogenated (trans-free) margarines. Vegetable oils, including soybean, sesame, sunflower, olive, peanut, safflower, corn, canola, and cottonseed. Salad dressings or mayonnaise made with a vegetable oil. Limit added fats and oils that you use for cooking, baking, salads, and as spreads. Other Cocoa powder. Coffee and tea. All seasonings and condiments. The items listed above may not be a complete list of recommended foods or beverages. Contact your dietitian for more options. What foods are not recommended? Grains Breads that are made with saturated or trans fats, oils, or whole milk. Croissants. Butter rolls. Cheese breads. Sweet rolls. Donuts. Buttered popcorn. Chow mein noodles. High-fat crackers, such as cheese or butter crackers. Meats and Other Protein Sources Fatty meats, such as hotdogs, short ribs, sausage, spareribs, bacon, rib eye roast or steak, and mutton. High-fat deli meats, such as salami and bologna. Caviar. Domestic duck and goose. Organ meats, such as kidney, liver, sweetbreads, and heart. Dairy Cream, sour cream, cream cheese, and creamed cottage cheese. Whole-milk cheeses, including blue (bleu), [address], [place], [place], [address], [place], [address], cheddar, [place], and [place]. Whole or 2% milk that is  liquid, evaporated, or condensed. Whole buttermilk. Cream sauce or high-fat cheese sauce. Yogurt that is made from whole milk. Beverages Regular sodas and juice drinks with added sugar. Sweets and Desserts Frosting. Pudding. Cookies. Cakes other than angel food cake. Candy that has milk chocolate or white chocolate, hydrogenated fat, butter, coconut, or unknown ingredients. Buttered syrups. Full-fat ice cream or ice cream drinks. Fats and Oils Gravy that has suet, meat fat, or shortening. Cocoa butter, hydrogenated oils, palm oil, coconut oil, palm kernel oil. These can often be found in baked products, candy, fried foods, nondairy creamers, and whipped toppings. Solid fats and shortenings, including bacon fat, salt pork, lard, and butter. Nondairy cream substitutes, such as coffee creamers and sour cream substitutes. Salad dressings that are made of unknown oils, cheese, or sour cream. The items listed above may not be a complete list of foods and beverages to avoid. Contact your dietitian for more information. This information is not intended to replace advice given to you by your health care  provider. Make sure you discuss any questions you have with your health care provider. Document Released: 06/19/2011 Document Revised: 05/26/2015 Document Reviewed: 06/11/2013 Elsevier Interactive Patient Education  [employer].

## 2016-10-04 NOTE — Plan of Care (Signed)
Problem: Pain Managment: Goal: General experience of comfort will improve Outcome: Progressing No complaints of pain this shift.   Problem: Physical Regulation: Goal: Ability to maintain clinical measurements within normal limits will improve Outcome: Progressing CIWA score continues to be 0, no Ativan needed.  Vital signs stable. Patient rested throughout shift.

## 2016-10-04 NOTE — Care Management Note (Signed)
Case Management Note  Patient Details  Name: Yaiden Yang MRN: [id number] Date of Birth: [birth year]  Subjective/Objective:   Pt presents with Chest Pain. Pt has Medicaid Established (pt should have no problem getting medications). CM did reach out to ([facility]Partnership for [facility] in regards to community resources.                Action/Plan: Per pt he will d/c to his moms home and stay until Monday when he states he will go to [facility] in [place]. Liaison for [facility] provided pt with resources to the [employer], [employer] and the number to DSS. Medicaid Card lists- Dr. Julio Sicks as primary provider. Pt will need to call and get established with a PCP. List of Physicians given to patient from Liaison from [facility]. Pt will have to call the office to set up an appointment once d/c from [facility]. No Further needs from CM at this time.   Expected Discharge Date:  10/04/16               Expected Discharge Plan:  Home/Self Care (Plan to go to his mom's home. )  In-House Referral:  Clinical Social Work  Discharge planning Services  CM Consult  Post Acute Care Choice:  NA Choice offered to:  Patient  DME Arranged:  N/A DME Agency:  NA  HH Arranged:  NA HH Agency:  NA  Status of Service:  Completed, signed off  If discussed at [employer], dates discussed:    Additional Comments:  Gala Lewandowsky, RN 10/04/2016, 11:27 AM
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
## 2016-10-04 NOTE — Progress Notes (Signed)
CSW assisted patient to make intake appointment at [facility] in [facility] for Monday 10/08/16. Patient indicated he can stay with his mother over the weekend until the intake. Patient needs assistance with transportation to his mother's house. CSW will provide taxi voucher. CSW signing off.  Abigail Butts, LCSWA [phone]

## 2017-06-04 ENCOUNTER — Emergency Department (HOSPITAL_COMMUNITY)
Admission: EM | Admit: 2017-06-04 | Discharge: 2017-06-04 | Disposition: A | Discharge status: Home / Self Care | Payer: Medicaid Other | Attending: Emergency Medicine | Admitting: Emergency Medicine

## 2017-06-04 ENCOUNTER — Emergency Department (HOSPITAL_COMMUNITY): Payer: Medicaid Other

## 2017-06-04 DIAGNOSIS — W19XXXA Unspecified fall, initial encounter: Secondary | ICD-10-CM

## 2017-06-04 DIAGNOSIS — M62838 Other muscle spasm: Secondary | ICD-10-CM | POA: Insufficient documentation

## 2017-06-04 DIAGNOSIS — F17210 Nicotine dependence, cigarettes, uncomplicated: Secondary | ICD-10-CM | POA: Insufficient documentation

## 2017-06-04 DIAGNOSIS — R109 Unspecified abdominal pain: Secondary | ICD-10-CM | POA: Diagnosis present

## 2017-06-04 LAB — URINALYSIS, ROUTINE W REFLEX MICROSCOPIC
BILIRUBIN URINE: NEGATIVE
Glucose, UA: NEGATIVE mg/dL
HGB URINE DIPSTICK: NEGATIVE
Ketones, ur: 5 mg/dL — AB
Leukocytes, UA: NEGATIVE
Nitrite: NEGATIVE
PROTEIN: NEGATIVE mg/dL
Specific Gravity, Urine: 1.03 (ref 1.005–1.030)
pH: 5 (ref 5.0–8.0)

## 2017-06-04 LAB — CBC WITH DIFFERENTIAL/PLATELET
Basophils Absolute: 0.1 10*3/uL (ref 0.0–0.1)
Basophils Relative: 1 %
EOS PCT: 1 %
Eosinophils Absolute: 0.1 10*3/uL (ref 0.0–0.7)
HEMATOCRIT: 42.9 % (ref 39.0–52.0)
Hemoglobin: 14.5 g/dL (ref 13.0–17.0)
LYMPHS ABS: 1.8 10*3/uL (ref 0.7–4.0)
LYMPHS PCT: 34 %
MCH: 33 pg (ref 26.0–34.0)
MCHC: 33.8 g/dL (ref 30.0–36.0)
MCV: 97.7 fL (ref 78.0–100.0)
Monocytes Absolute: 0.5 10*3/uL (ref 0.1–1.0)
Monocytes Relative: 10 %
NEUTROS ABS: 2.8 10*3/uL (ref 1.7–7.7)
Neutrophils Relative %: 54 %
PLATELETS: 247 10*3/uL (ref 150–400)
RBC: 4.39 MIL/uL (ref 4.22–5.81)
RDW: 12.4 % (ref 11.5–15.5)
WBC: 5.3 10*3/uL (ref 4.0–10.5)

## 2017-06-04 LAB — COMPREHENSIVE METABOLIC PANEL
ALK PHOS: 49 U/L (ref 38–126)
ALT: 21 U/L (ref 17–63)
AST: 23 U/L (ref 15–41)
Albumin: 4.6 g/dL (ref 3.5–5.0)
Anion gap: 9 (ref 5–15)
BILIRUBIN TOTAL: 0.9 mg/dL (ref 0.3–1.2)
BUN: 12 mg/dL (ref 6–20)
CALCIUM: 9.5 mg/dL (ref 8.9–10.3)
CHLORIDE: 106 mmol/L (ref 101–111)
CO2: 27 mmol/L (ref 22–32)
CREATININE: 1.08 mg/dL (ref 0.61–1.24)
Glucose, Bld: 101 mg/dL — ABNORMAL HIGH (ref 65–99)
Potassium: 3.4 mmol/L — ABNORMAL LOW (ref 3.5–5.1)
Sodium: 142 mmol/L (ref 135–145)
TOTAL PROTEIN: 7.8 g/dL (ref 6.5–8.1)

## 2017-06-04 LAB — LIPASE, BLOOD: LIPASE: 20 U/L (ref 11–51)

## 2017-06-04 MED ORDER — IOPAMIDOL (ISOVUE-300) INJECTION 61%
INTRAVENOUS | Status: AC
  Administered 2017-06-04: 15:00:00
  Filled 2017-06-04: qty 100

## 2017-06-04 MED ORDER — METHOCARBAMOL 500 MG PO TABS: 500.0000 mg | ORAL_TABLET | Freq: Every evening | ORAL | 0 refills | Status: AC | PRN

## 2017-06-04 MED ORDER — KETOROLAC TROMETHAMINE 15 MG/ML IJ SOLN
15.0000 mg | Freq: Once | INTRAMUSCULAR | Status: AC
  Administered 2017-06-04: 15 mg via INTRAVENOUS
  Filled 2017-06-04: qty 1

## 2017-06-04 MED ORDER — IOPAMIDOL (ISOVUE-300) INJECTION 61%
100.0000 mL | Freq: Once | INTRAVENOUS | Status: AC | PRN
  Administered 2017-06-04: 100 mL via INTRAVENOUS

## 2017-06-04 MED ORDER — NAPROXEN 250 MG PO TABS: 250.0000 mg | ORAL_TABLET | Freq: Four times a day (QID) | ORAL | 0 refills | Status: AC | PRN

## 2017-06-04 NOTE — ED Notes (Signed)
Bed: WTR8 Expected date:  Expected time:  Means of arrival:  Comments: 

## 2017-06-04 NOTE — ED Notes (Signed)
Pt aware UA needed. Pt provided urinal 

## 2017-06-04 NOTE — ED Notes (Signed)
Bed: [id number] Expected date:  Expected time:  Means of arrival:  Comments: Triage 8
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
## 2017-06-04 NOTE — Discharge Instructions (Addendum)
You may alternate taking Tylenol and naproxen as needed for pain control. You may take 250 mg of Naproxen every 6 hours and [phone] mg of Tylenol every 6 hours. Do not exceed 4000 mg of Tylenol daily as this can lead to liver damage. Also, make sure to take Ibuprofen with meals as it can cause an upset stomach. Do not take other NSAIDs while taking Ibuprofen such as (Aleve, ibuprofen, Aspirin, Celebrex, etc) and do not take more than the prescribed dose as this can lead to ulcers and bleeding in your GI tract. You may use warm and cold compresses to help with your symptoms.   You were given a prescription for Robaxin which is a muscle relaxer.  You should not drive, work, or operate machinery while taking this medication as it can make you very drowsy.    Please follow up with your primary doctor within the next 7-10 days for re-evaluation and further treatment of your symptoms.   Please return to the ER sooner if you have any new or worsening symptoms.
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
## 2018-09-12 IMAGING — CT CT ABD-PELV W/ CM
2 of 5 series · 15 of 46 positions shown, 17 images · IV contrast (ISOVUE)
Comparison: None.

CLINICAL DATA: Pain following fall

EXAM:
CT ABDOMEN AND PELVIS WITH CONTRAST
TECHNIQUE: Multidetector CT imaging of the abdomen and pelvis was performed
using the standard protocol following bolus administration of
intravenous contrast.
CONTRAST:  100mL LBJHE6-1BB IOPAMIDOL (LBJHE6-1BB) INJECTION 61%

[Series 2: axial st · axial · 0.70mm/px · z∈[+1138,+1528]mm · 12 of 91 slices shown, 14 images]
[im 7/91  soft-tissue]
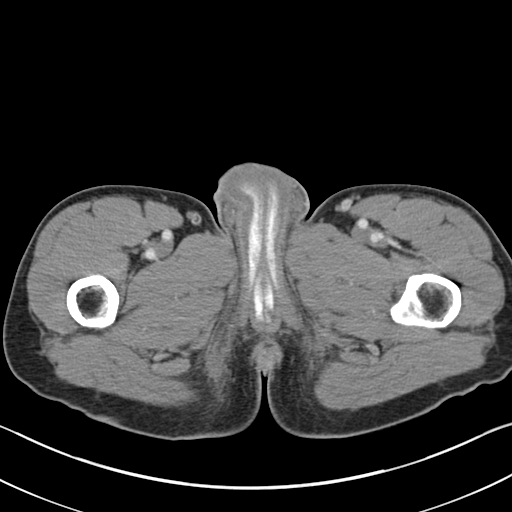
[im 7/91  bone]
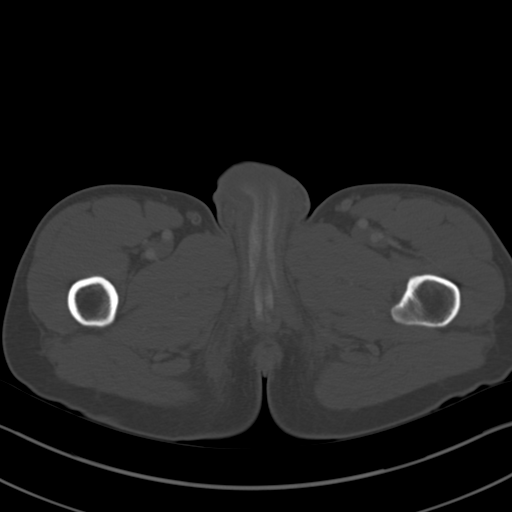
[im 13/91  soft-tissue]
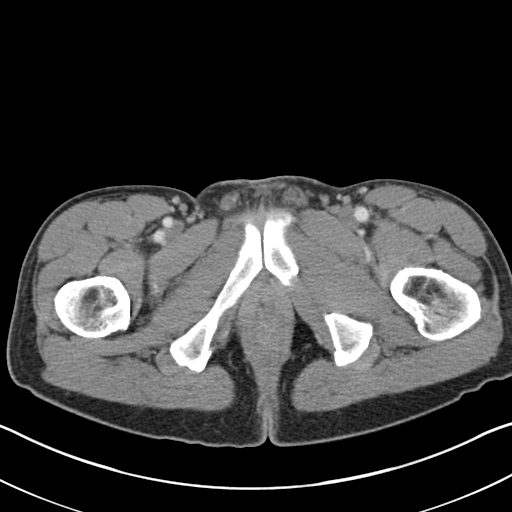
[im 19/91  soft-tissue]
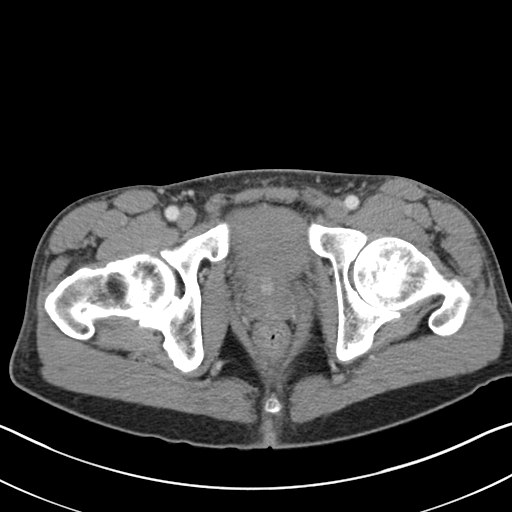
[im 31/91  soft-tissue]
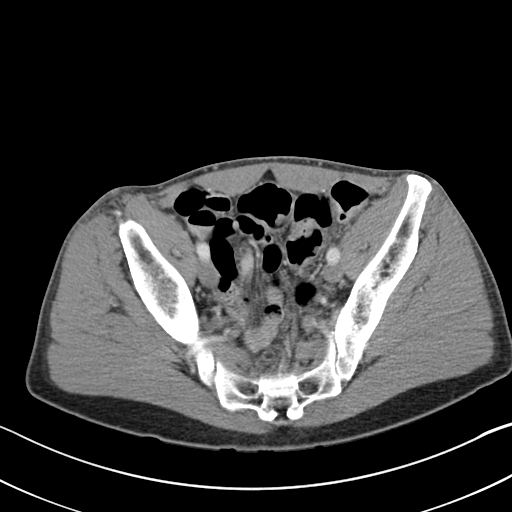
[im 37/91  soft-tissue]
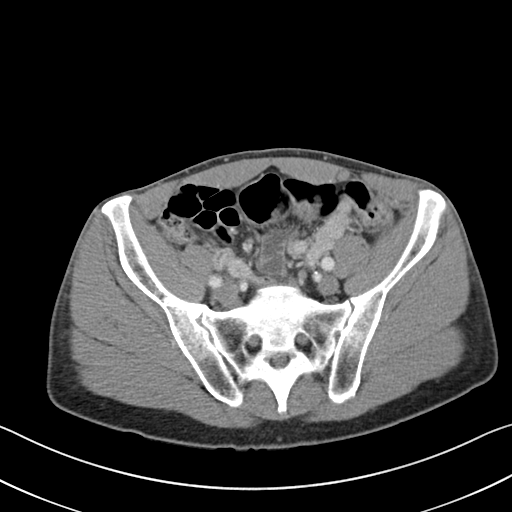
[im 43/91  soft-tissue]
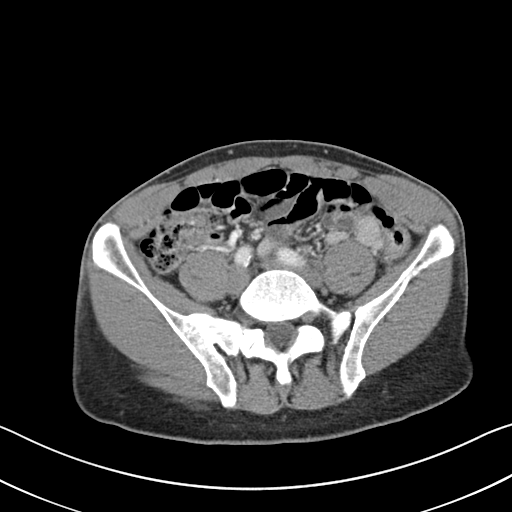
[im 49/91  soft-tissue]
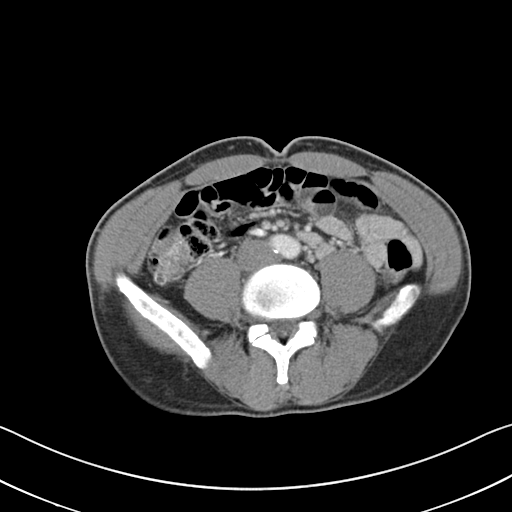
[im 55/91  soft-tissue]
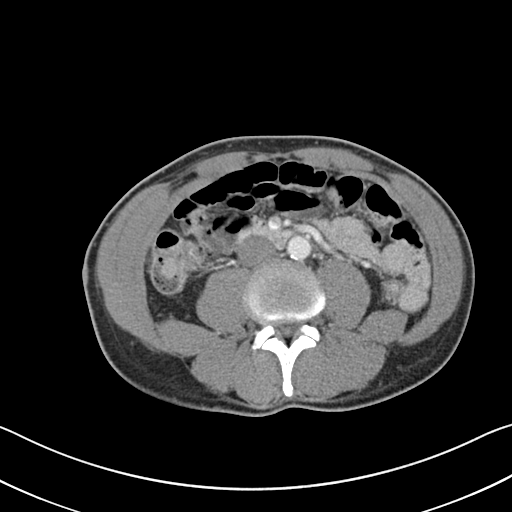
[im 61/91  soft-tissue]
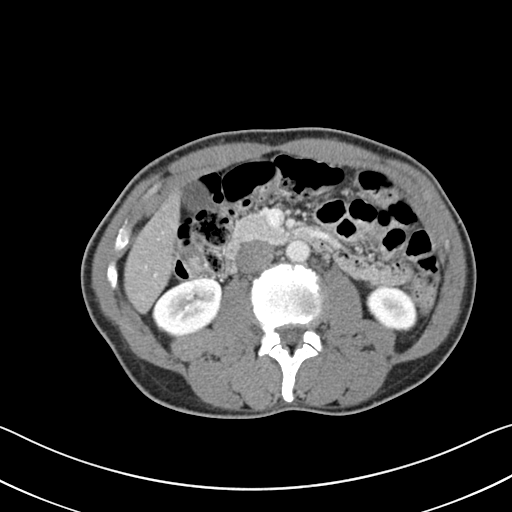
[im 61/91  bone]
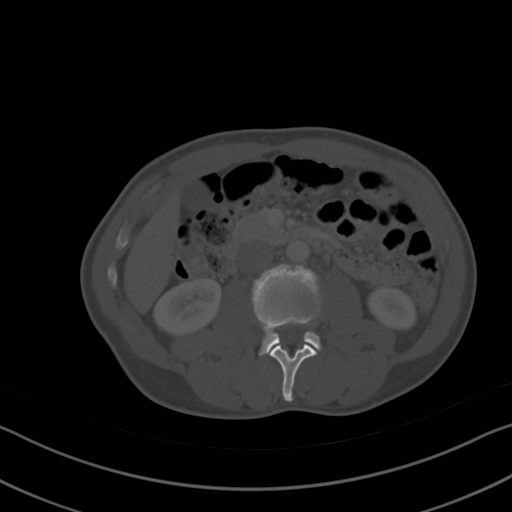
[im 73/91  soft-tissue]
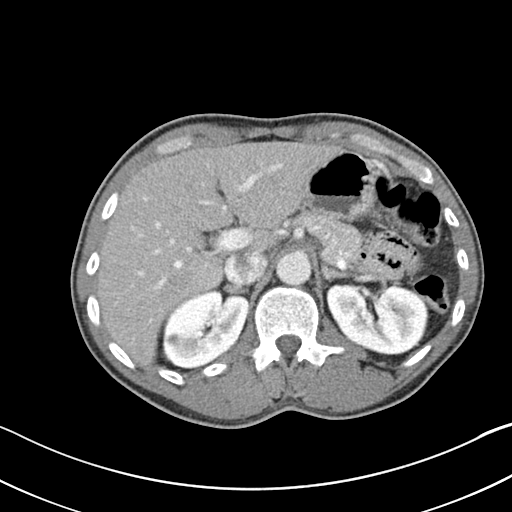
[im 79/91  soft-tissue]
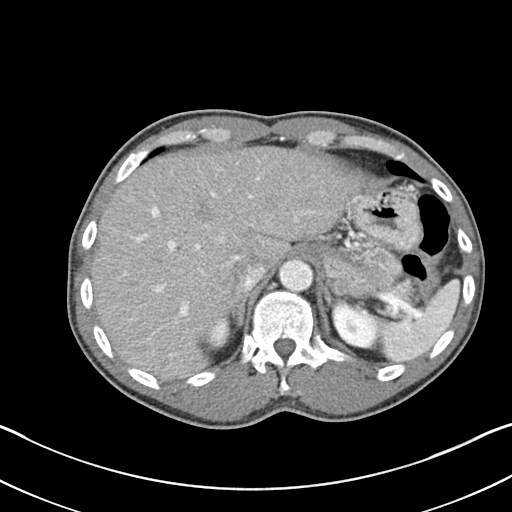
[im 85/91  soft-tissue]
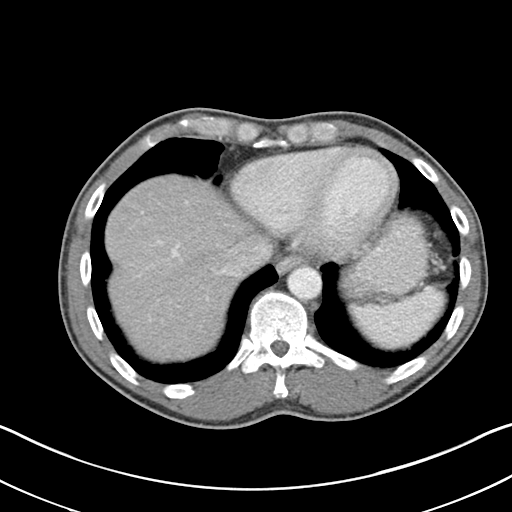

[Series 5: coronal st · coronal · 0.68mm/px · 3 of 101 slices shown]
[im 34/101  soft-tissue]
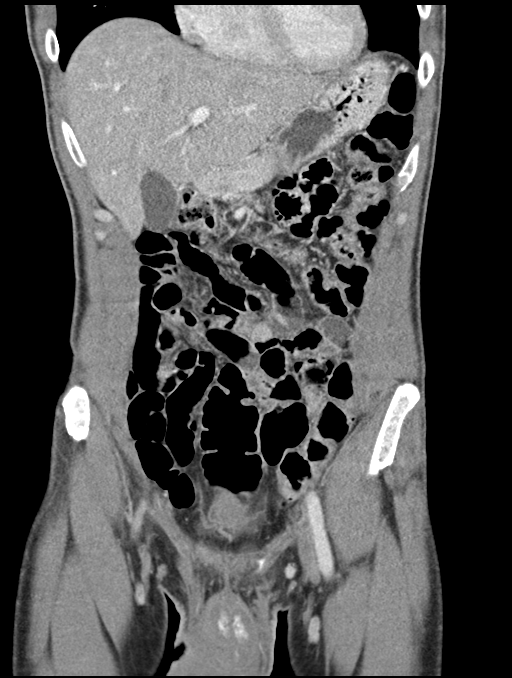
[im 45/101  soft-tissue]
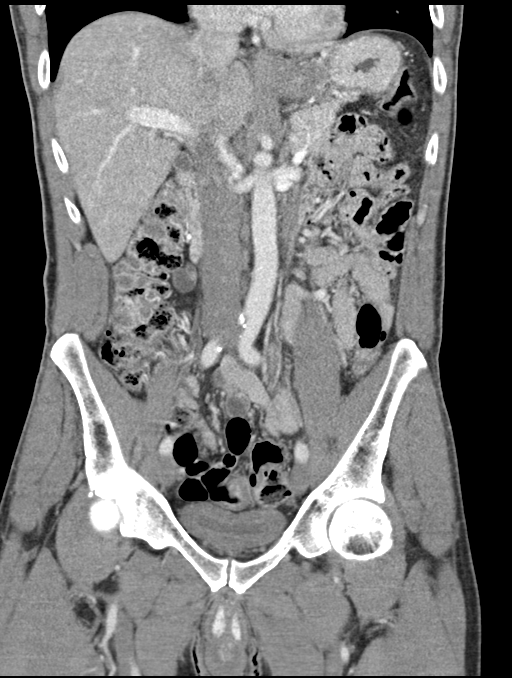
[im 56/101  soft-tissue]
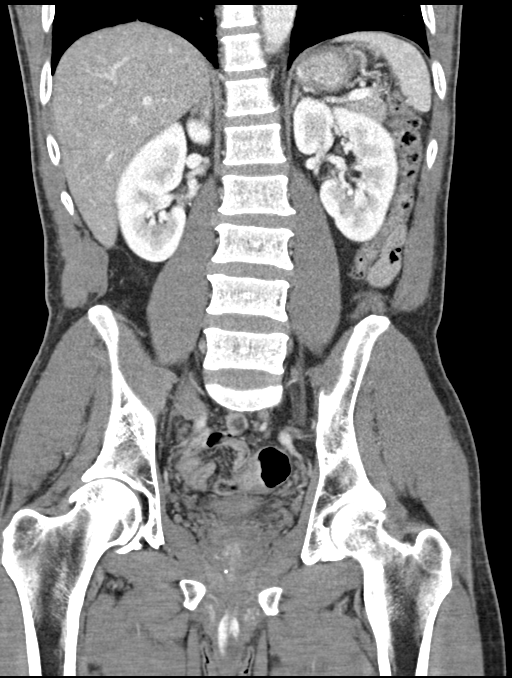

[15 of 46 positions shown; findings below may reference images not displayed]

FINDINGS: Lower chest: There is bibasilar atelectasis. No consolidation or
pneumothorax in the lung base regions.

Hepatobiliary: Liver appears intact without evident laceration or
rupture. No perihepatic fluid. There is fatty infiltration near the
fissure for the ligamentum teres. No focal liver lesions are
evident. Gallbladder wall is not appreciably thickened. There is no
biliary duct dilatation.

Pancreas: No pancreatic mass or inflammatory focus. There is no
peripancreatic fluid.

Spleen: Spleen appears intact without laceration or rupture. There
is no splenic lesion evident. No perisplenic fluid.

Adrenals/Urinary Tract: Adrenals bilaterally appear normal. There is
a cyst in the lateral mid right kidney measuring 1.2 x 0.8 cm. There
is no hydronephrosis on either side. There is no contrast
extravasation. There is no perinephric stranding or thickening. No
perinephric fluid. No renal laceration or rupture evident. There is
no renal or ureteral calculus on either side. Urinary bladder is
midline. There is generalized thickening of the urinary bladder
wall. No perivesical fluid or stranding evident.

Stomach/Bowel: There is no appreciable bowel wall or mesenteric
thickening. No evident bowel obstruction. No free air or portal
venous air.

Vascular/Lymphatic: There is mild atherosclerotic calcification in
the right common iliac artery. No aneurysm is evident. Major
mesenteric arterial vessels appear intact and patent. No periaortic
fluid noted. There is no evident adenopathy in the abdomen or
pelvis.

Reproductive: Prostate and seminal vesicles appear normal. No
abnormal fluid collections are noted in the visualized penile
region. There is symmetric enhancement of each corpora spongiosum,
likely within normal limits. No pelvic mass or pelvic fluid
collection evident.

Other: Appendix is not convincingly seen. There is no
periappendiceal region inflammatory change. No intraperitoneal or
retroperitoneal fluid collection. No abscess or ascites evident in
the abdomen or pelvis.

Musculoskeletal: There is mild lumbar levoscoliosis. No evident
fracture or dislocation. No blastic or lytic bone lesions. No
intramuscular or abdominal wall lesions evident.
IMPRESSION: 1.  No traumatic appearing lesion evident.

2. Urinary bladder wall thickening, likely indicative of cystitis.
No hydronephrosis on either side. No renal or ureteral calculus.

3. No bowel wall thickening or bowel obstruction. No abnormal fluid
collections in the abdomen or pelvis. No abscess. No periappendiceal
region inflammatory change.

## 2023-05-03 ENCOUNTER — Emergency Department (HOSPITAL_COMMUNITY)
Admission: EM | Admit: 2023-05-03 | Discharge: 2023-05-03 | Attending: Emergency Medicine | Admitting: Emergency Medicine

## 2023-05-03 ENCOUNTER — Emergency Department (HOSPITAL_COMMUNITY)

## 2023-05-03 ENCOUNTER — Other Ambulatory Visit: Payer: Self-pay

## 2023-05-03 DIAGNOSIS — W260XXA Contact with knife, initial encounter: Secondary | ICD-10-CM | POA: Insufficient documentation

## 2023-05-03 DIAGNOSIS — S61210A Laceration without foreign body of right index finger without damage to nail, initial encounter: Secondary | ICD-10-CM | POA: Diagnosis present

## 2023-05-03 MED ORDER — LIDOCAINE HCL (PF) 1 % IJ SOLN
10.0000 mL | Freq: Once | INTRAMUSCULAR | Status: AC
  Administered 2023-05-03: 10 mL
  Filled 2023-05-03: qty 30

## 2023-05-03 MED ORDER — BACITRACIN ZINC 500 UNIT/GM EX OINT
TOPICAL_OINTMENT | Freq: Two times a day (BID) | CUTANEOUS | Status: DC
  Administered 2023-05-03: 1 via TOPICAL
  Filled 2023-05-03: qty 1.8

## 2023-05-03 MED ORDER — CEPHALEXIN 500 MG PO CAPS: 500.0000 mg | ORAL_CAPSULE | Freq: Four times a day (QID) | ORAL | 0 refills | Status: AC
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
## 2023-05-03 NOTE — Discharge Instructions (Addendum)
 You were seen in the Emergency Department today for evaluation of your laceration. I am glad we were able to repair this for you. Please make sure that you are remembering to keep your wound clean daily with Dial soap and water and daily bandage changes. I recommend keeping the wound covered for the next 48-72 hours and then to your comfort afterwards. Do not expose the wound to any dishwater, pools, lakes, oceans, [employer], dirt or grime. Keeping the wound clean and away from contamination can help ensure good wound healing and help to prevent infections. You will need to return in 7-10 days for suture removal. This can be down at your primary care office, urgent care, or the ER.  For pain, I recommend Tylenol  1000mg  and/or ibuprofen  600mg  every 6 hours as needed for painPlease make sure to take your antibiotic as prescribed and complete the entirety of the course. If you have any concerns, new or worsening symptoms, please return to the nearest ER for re-evaluation.   Contact a doctor if: You got a tetanus shot and you have any of these problems where the needle went in: Swelling. Very bad pain. Redness. Bleeding. A wound that was closed breaks open. You have a fever. You have any of these signs of infection in your wound: More redness, swelling, or pain. Fluid or blood. Warmth. Pus or a bad smell. You see something coming out of the wound, such as wood or glass. Medicine does not make your pain go away. You notice a change in the color of your skin near your wound. You need to change the bandage often. You have a new rash. You lose feeling (have numbness) around the wound. Get help right away if: You have very bad swelling around the wound. Your pain suddenly gets worse and is very bad. You have painful lumps near the wound or on skin anywhere on your body. You have a red streak going away from your wound. The wound is on your hand or foot, and: You cannot move a finger or toe. Your  fingers or toes look pale or bluish.
